# Patient Record
Sex: Male | Born: 1985 | Race: White | Hispanic: No | Marital: Single | Smoking: Current every day smoker
Health system: Southern US, Community
[De-identification: ages and names within clinical notes are randomized; demographics above are authoritative.]

---
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
## 2010-08-11 ENCOUNTER — Emergency Department (HOSPITAL_COMMUNITY): Admission: EM | Admit: 2010-08-11 | Discharge: 2010-08-11 | Payer: Self-pay | Admitting: Emergency Medicine

## 2014-07-14 ENCOUNTER — Emergency Department (HOSPITAL_COMMUNITY)
Admission: EM | Admit: 2014-07-14 | Discharge: 2014-07-14 | Disposition: A | Discharge status: Home / Self Care | Payer: Self-pay | Attending: Emergency Medicine | Admitting: Emergency Medicine

## 2014-07-14 ENCOUNTER — Emergency Department (HOSPITAL_COMMUNITY): Payer: Self-pay

## 2014-07-14 ENCOUNTER — Encounter (HOSPITAL_COMMUNITY): Payer: Self-pay | Admitting: Emergency Medicine

## 2014-07-14 DIAGNOSIS — S82841A Displaced bimalleolar fracture of right lower leg, initial encounter for closed fracture: Secondary | ICD-10-CM | POA: Insufficient documentation

## 2014-07-14 DIAGNOSIS — W51XXXA Accidental striking against or bumped into by another person, initial encounter: Secondary | ICD-10-CM | POA: Insufficient documentation

## 2014-07-14 DIAGNOSIS — Y9289 Other specified places as the place of occurrence of the external cause: Secondary | ICD-10-CM | POA: Insufficient documentation

## 2014-07-14 DIAGNOSIS — Y9372 Activity, wrestling: Secondary | ICD-10-CM | POA: Insufficient documentation

## 2014-07-14 DIAGNOSIS — Z720 Tobacco use: Secondary | ICD-10-CM | POA: Insufficient documentation

## 2014-07-14 MED ORDER — HYDROCODONE-ACETAMINOPHEN 5-325 MG PO TABS: 1.0000 | ORAL_TABLET | Freq: Four times a day (QID) | ORAL | Status: DC | PRN

## 2014-07-14 NOTE — ED Provider Notes (Signed)
Medical screening examination/treatment/procedure(s) were performed by non-physician practitioner and as supervising physician I was immediately available for consultation/collaboration.   EKG Interpretation None        Brent LennertJoseph L Rai Sinagra, MD 07/14/14 [phone]
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
## 2014-07-14 NOTE — Discharge Instructions (Signed)
Keep leg elevated and follow up with dr. Romeo AppleHarrison next week.

## 2014-07-15 ENCOUNTER — Encounter: Payer: Self-pay | Admitting: Orthopedic Surgery

## 2014-07-15 ENCOUNTER — Ambulatory Visit (INDEPENDENT_AMBULATORY_CARE_PROVIDER_SITE_OTHER): Payer: Self-pay | Admitting: Orthopedic Surgery

## 2014-07-15 VITALS — BP 156/103 | Ht 72.0 in | Wt 240.0 lb

## 2014-07-15 DIAGNOSIS — S82843A Displaced bimalleolar fracture of unspecified lower leg, initial encounter for closed fracture: Secondary | ICD-10-CM | POA: Insufficient documentation

## 2014-07-15 DIAGNOSIS — S82841A Displaced bimalleolar fracture of right lower leg, initial encounter for closed fracture: Secondary | ICD-10-CM

## 2014-07-15 MED ORDER — HYDROCODONE-ACETAMINOPHEN 7.5-325 MG PO TABS: 1.0000 | ORAL_TABLET | ORAL | Status: DC | PRN
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
## 2014-07-15 NOTE — Patient Instructions (Addendum)
Splint NO WEIGHT BEARING X 4 weeks   Ankle Fracture A fracture is a break in a bone. The ankle joint is made up of three bones. These include the lower (distal)sections of your lower leg bones, called the tibia and fibula, along with a bone in your foot, called the talus. Depending on how bad the break is and if more than one ankle joint bone is broken, a cast or splint is used to protect and keep your injured bone from moving while it heals. Sometimes, surgery is required to help the fracture heal properly.  There are two general types of fractures:  Stable fracture. This includes a single fracture line through one bone, with no injury to ankle ligaments. A fracture of the talus that does not have any displacement (movement of the bone on either side of the fracture line) is also stable.  Unstable fracture. This includes more than one fracture line through one or more bones in the ankle joint. It also includes fractures that have displacement of the bone on either side of the fracture line. CAUSES  A direct blow to the ankle.   Quickly and severely twisting your ankle.  Trauma, such as a car accident or falling from a significant height. RISK FACTORS You may be at a higher risk of ankle fracture if:  You have certain medical conditions.  You are involved in high-impact sports.  You are involved in a high-impact car accident. SIGNS AND SYMPTOMS   Tender and swollen ankle.  Bruising around the injured ankle.  Pain on movement of the ankle.  Difficulty walking or putting weight on the ankle.  A cold foot below the site of the ankle injury. This can occur if the blood vessels passing through your injured ankle were also damaged.  Numbness in the foot below the site of the ankle injury. DIAGNOSIS  An ankle fracture is usually diagnosed with a physical exam and X-rays. A CT scan may also be required for complex fractures. TREATMENT  Stable fractures are treated with a cast or  splint and using crutches to avoid putting weight on your injured ankle. This is followed by an ankle strengthening program. Some patients require a special type of cast, depending on other medical problems they may have. Unstable fractures require surgery to ensure the bones heal properly. Your health care provider will tell you what type of fracture you have and the best treatment for your condition. HOME CARE INSTRUCTIONS   Review correct crutch use with your health care provider and use your crutches as directed. Safe use of crutches is extremely important. Misuse of crutches can cause you to fall or cause injury to nerves in your hands or armpits.  Do not put weight or pressure on the injured ankle until directed by your health care provider.  To lessen the swelling, keep the injured leg elevated while sitting or lying down.  Apply ice to the injured area:  Put ice in a plastic bag.  Place a towel between your cast and the bag.  Leave the ice on for 20 minutes, 2-3 times a day.  If you have a plaster or fiberglass cast:  Do not try to scratch the skin under the cast with any objects. This can increase your risk of skin infection.  Check the skin around the cast every day. You may put lotion on any red or sore areas.  Keep your cast dry and clean.  If you have a plaster splint:  Wear the  splint as directed.  You may loosen the elastic around the splint if your toes become numb, tingle, or turn cold or blue.  Do not put pressure on any part of your cast or splint; it may break. Rest your cast only on a pillow the first 24 hours until it is fully hardened.  Your cast or splint can be protected during bathing with a plastic bag sealed to your skin with medical tape. Do not lower the cast or splint into water.  Take medicines as directed by your health care provider. Only take over-the-counter or prescription medicines for pain, discomfort, or fever as directed by your health care  provider.  Do not drive a vehicle until your health care provider specifically tells you it is safe to do so.  If your health care provider has given you a follow-up appointment, it is very important to keep that appointment. Not keeping the appointment could result in a chronic or permanent injury, pain, and disability. If you have any problem keeping the appointment, call the facility for assistance. SEEK MEDICAL CARE IF: You develop increased swelling or discomfort. SEEK IMMEDIATE MEDICAL CARE IF:   Your cast gets damaged or breaks.  You have continued severe pain.  You develop new pain or swelling after the cast was put on.  Your skin or toenails below the injury turn blue or gray.  Your skin or toenails below the injury feel cold, numb, or have loss of sensitivity to touch.  There is a bad smell or pus draining from under the cast. MAKE SURE YOU:   Understand these instructions.  Will watch your condition.  Will get help right away if you are not doing well or get worse. Document Released: 09/10/2000 Document Revised: 09/18/2013 Document Reviewed: 04/12/2013 [facility] Patient Information 2015 [place]. This information is not intended to replace advice given to you by your health care provider. Make sure you discuss any questions you have with your health care provider.

## 2014-07-29 ENCOUNTER — Encounter: Payer: Self-pay | Admitting: Orthopedic Surgery

## 2014-07-29 ENCOUNTER — Ambulatory Visit (INDEPENDENT_AMBULATORY_CARE_PROVIDER_SITE_OTHER): Payer: Self-pay

## 2014-07-29 ENCOUNTER — Ambulatory Visit (INDEPENDENT_AMBULATORY_CARE_PROVIDER_SITE_OTHER): Payer: Self-pay | Admitting: Orthopedic Surgery

## 2014-07-29 VITALS — BP 170/113 | Ht 72.0 in | Wt 240.0 lb

## 2014-07-29 DIAGNOSIS — S82891A Other fracture of right lower leg, initial encounter for closed fracture: Secondary | ICD-10-CM

## 2014-07-29 DIAGNOSIS — S82841A Displaced bimalleolar fracture of right lower leg, initial encounter for closed fracture: Secondary | ICD-10-CM

## 2014-07-29 MED ORDER — HYDROCODONE-ACETAMINOPHEN 10-325 MG PO TABS: 1.0000 | ORAL_TABLET | Freq: Four times a day (QID) | ORAL | Status: DC | PRN

## 2014-07-29 NOTE — Progress Notes (Signed)
Patient ID: Brent Wheeler, male   DOB: 04/09/1986, 28 y.o.   MRN: [id number] Chief Complaint  Patient presents with  . Follow-up    2 week follow up + xray Right ankle, cast application DOS 07/13/14   X-ray out of plaster no change in the position of the mortise. Bimalleolar fracture. Short leg nonweightbearing cast. Refill prescription with 10 mg hydrocodone increase in medication from last visit.  Note skin intact. Foot plantigrade. Cast applied.

## 2014-08-06 ENCOUNTER — Telehealth: Payer: Self-pay | Admitting: Orthopedic Surgery

## 2014-08-06 NOTE — Telephone Encounter (Signed)
Routing to Dr Harrison 

## 2014-08-06 NOTE — Telephone Encounter (Signed)
Patient is calling stating that the current pain medication is not helping HYDROcodone-acetaminophen (NORCO) 10-325 MG per tablet, he is asking for something different he states since he got the new cast on he is hurting worse, please advise?

## 2014-08-06 NOTE — Telephone Encounter (Signed)
Patients mother has called back asking that the patient not be aware that shes called stating that he has taken 188 pain pills in the last 3 weeks and she is worried because he has had a prescription drug abuse problem in the past and she dont want him to go through that again.

## 2014-08-07 NOTE — Telephone Encounter (Signed)
Patient aware.

## 2014-08-07 NOTE — Telephone Encounter (Signed)
Ibuprofen  800 mg tid  or tylenol 500 mg q6

## 2014-08-12 ENCOUNTER — Ambulatory Visit: Payer: Self-pay | Admitting: Orthopedic Surgery

## 2014-08-12 NOTE — Telephone Encounter (Signed)
NOTED

## 2014-08-17 ENCOUNTER — Encounter (HOSPITAL_COMMUNITY): Payer: Self-pay | Admitting: Emergency Medicine

## 2014-08-17 ENCOUNTER — Emergency Department (HOSPITAL_COMMUNITY)
Admission: EM | Admit: 2014-08-17 | Discharge: 2014-08-17 | Disposition: A | Discharge status: Home / Self Care | Payer: Self-pay | Attending: Emergency Medicine | Admitting: Emergency Medicine

## 2014-08-17 ENCOUNTER — Emergency Department (HOSPITAL_COMMUNITY): Payer: No Typology Code available for payment source

## 2014-08-17 DIAGNOSIS — S39012A Strain of muscle, fascia and tendon of lower back, initial encounter: Secondary | ICD-10-CM | POA: Insufficient documentation

## 2014-08-17 DIAGNOSIS — M25571 Pain in right ankle and joints of right foot: Secondary | ICD-10-CM | POA: Insufficient documentation

## 2014-08-17 DIAGNOSIS — Z720 Tobacco use: Secondary | ICD-10-CM | POA: Insufficient documentation

## 2014-08-17 DIAGNOSIS — Y92410 Unspecified street and highway as the place of occurrence of the external cause: Secondary | ICD-10-CM | POA: Insufficient documentation

## 2014-08-17 DIAGNOSIS — Y998 Other external cause status: Secondary | ICD-10-CM | POA: Insufficient documentation

## 2014-08-17 DIAGNOSIS — S161XXA Strain of muscle, fascia and tendon at neck level, initial encounter: Secondary | ICD-10-CM | POA: Insufficient documentation

## 2014-08-17 DIAGNOSIS — Y9389 Activity, other specified: Secondary | ICD-10-CM | POA: Insufficient documentation

## 2014-08-17 MED ORDER — OXYCODONE-ACETAMINOPHEN 5-325 MG PO TABS
1.0000 | ORAL_TABLET | Freq: Once | ORAL | Status: AC
  Administered 2014-08-17: 1 via ORAL
  Filled 2014-08-17: qty 1

## 2014-08-17 MED ORDER — NAPROXEN 500 MG PO TABS: 500.0000 mg | ORAL_TABLET | Freq: Two times a day (BID) | ORAL | Status: AC

## 2014-08-17 MED ORDER — CYCLOBENZAPRINE HCL 10 MG PO TABS: 10.0000 mg | ORAL_TABLET | Freq: Three times a day (TID) | ORAL | Status: DC

## 2014-08-17 MED ORDER — CYCLOBENZAPRINE HCL 10 MG PO TABS
10.0000 mg | ORAL_TABLET | Freq: Once | ORAL | Status: AC
  Administered 2014-08-17: 10 mg via ORAL
  Filled 2014-08-17: qty 1

## 2014-08-17 MED ORDER — OXYCODONE-ACETAMINOPHEN 5-325 MG PO TABS: 1.0000 | ORAL_TABLET | ORAL | Status: AC | PRN
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
## 2014-08-17 NOTE — ED Notes (Signed)
Pt verbalized understanding of no driving and to use caution within 4 hours of taking pain meds due to meds cause drowsiness 
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
## 2014-08-26 ENCOUNTER — Encounter: Payer: Self-pay | Admitting: Orthopedic Surgery

## 2014-08-26 ENCOUNTER — Ambulatory Visit (INDEPENDENT_AMBULATORY_CARE_PROVIDER_SITE_OTHER): Payer: Self-pay | Admitting: Orthopedic Surgery

## 2014-08-26 ENCOUNTER — Ambulatory Visit (INDEPENDENT_AMBULATORY_CARE_PROVIDER_SITE_OTHER): Payer: Self-pay

## 2014-08-26 VITALS — BP 151/120 | Ht 72.0 in | Wt 235.0 lb

## 2014-08-26 DIAGNOSIS — S82891A Other fracture of right lower leg, initial encounter for closed fracture: Secondary | ICD-10-CM

## 2014-08-26 DIAGNOSIS — M542 Cervicalgia: Secondary | ICD-10-CM | POA: Insufficient documentation

## 2014-08-26 DIAGNOSIS — M545 Low back pain, unspecified: Secondary | ICD-10-CM

## 2014-08-26 MED ORDER — HYDROCODONE-ACETAMINOPHEN 10-325 MG PO TABS: 1.0000 | ORAL_TABLET | Freq: Four times a day (QID) | ORAL | Status: DC | PRN

## 2014-08-26 MED ORDER — IBUPROFEN 800 MG PO TABS: 800.0000 mg | ORAL_TABLET | Freq: Three times a day (TID) | ORAL | Status: AC | PRN

## 2014-08-26 MED ORDER — CYCLOBENZAPRINE HCL 10 MG PO TABS: 10.0000 mg | ORAL_TABLET | Freq: Three times a day (TID) | ORAL | Status: AC
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
## 2014-08-26 NOTE — Patient Instructions (Addendum)
WEAR BOOT X 4 WEEKS THEM COME BACK FOR XRAYS   NEW MEDS  FLEXERIL AND IBUPROFEN FOR NECK AND BACK   Meds ordered this encounter  Medications  . ibuprofen (ADVIL,MOTRIN) 800 MG tablet    Sig: Take 1 tablet (800 mg total) by mouth every 8 (eight) hours as needed.    Dispense:  90 tablet    Refill:  5  . HYDROcodone-acetaminophen (NORCO) 10-325 MG per tablet    Sig: Take 1 tablet by mouth every 6 (six) hours as needed.    Dispense:  84 tablet    Refill:  0  . cyclobenzaprine (FLEXERIL) 10 MG tablet    Sig: Take 1 tablet (10 mg total) by mouth 3 (three) times daily.    Dispense:  60 tablet    Refill:  0

## 2014-09-24 ENCOUNTER — Encounter: Payer: Self-pay | Admitting: Orthopedic Surgery

## 2014-09-24 ENCOUNTER — Ambulatory Visit (INDEPENDENT_AMBULATORY_CARE_PROVIDER_SITE_OTHER): Payer: Self-pay

## 2014-09-24 ENCOUNTER — Ambulatory Visit (INDEPENDENT_AMBULATORY_CARE_PROVIDER_SITE_OTHER): Payer: Self-pay | Admitting: Orthopedic Surgery

## 2014-09-24 VITALS — BP 136/93 | Ht 72.0 in | Wt 235.0 lb

## 2014-09-24 DIAGNOSIS — S82891A Other fracture of right lower leg, initial encounter for closed fracture: Secondary | ICD-10-CM

## 2014-09-24 DIAGNOSIS — S82841D Displaced bimalleolar fracture of right lower leg, subsequent encounter for closed fracture with routine healing: Secondary | ICD-10-CM

## 2014-09-24 MED ORDER — HYDROCODONE-ACETAMINOPHEN 7.5-325 MG PO TABS
1.0000 | ORAL_TABLET | Freq: Four times a day (QID) | ORAL | Status: AC | PRN
Start: 2014-09-24 — End: ?

## 2014-09-24 NOTE — Progress Notes (Signed)
Fracture care follow-up  BP 136/93 mmHg  Ht 6' (1.829 m)  Wt 235 lb (106.595 kg)  BMI 31.86 kg/m2 Chief Complaint  Patient presents with  . Follow-up    4 week recheck and xray right ankle, DOI 07/13/14   Encounter Diagnosis  Name Primary?  Marland Kitchen. Ankle fracture, right Yes    He still having some pain and tenderness over the fracture site although the x-ray show fracture healing  Recommend 4 weeks in his Cam Walker continue hydrocodone 7.5 mg every 6 #84 no refills clinical exam only when he comes back

## 2014-10-24 ENCOUNTER — Ambulatory Visit: Payer: Self-pay | Admitting: Orthopedic Surgery
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
